# Patient Record
Sex: Female | Born: 1960 | Hispanic: No
Health system: Northeastern US, Academic
[De-identification: ages and names within clinical notes are randomized; demographics above are authoritative.]

---

## 2010-05-26 IMAGING — CR RAD CHEST PA LAT
2 series · 2 of 2 positions shown · non-contrast
Comparison: none

[left lateral]
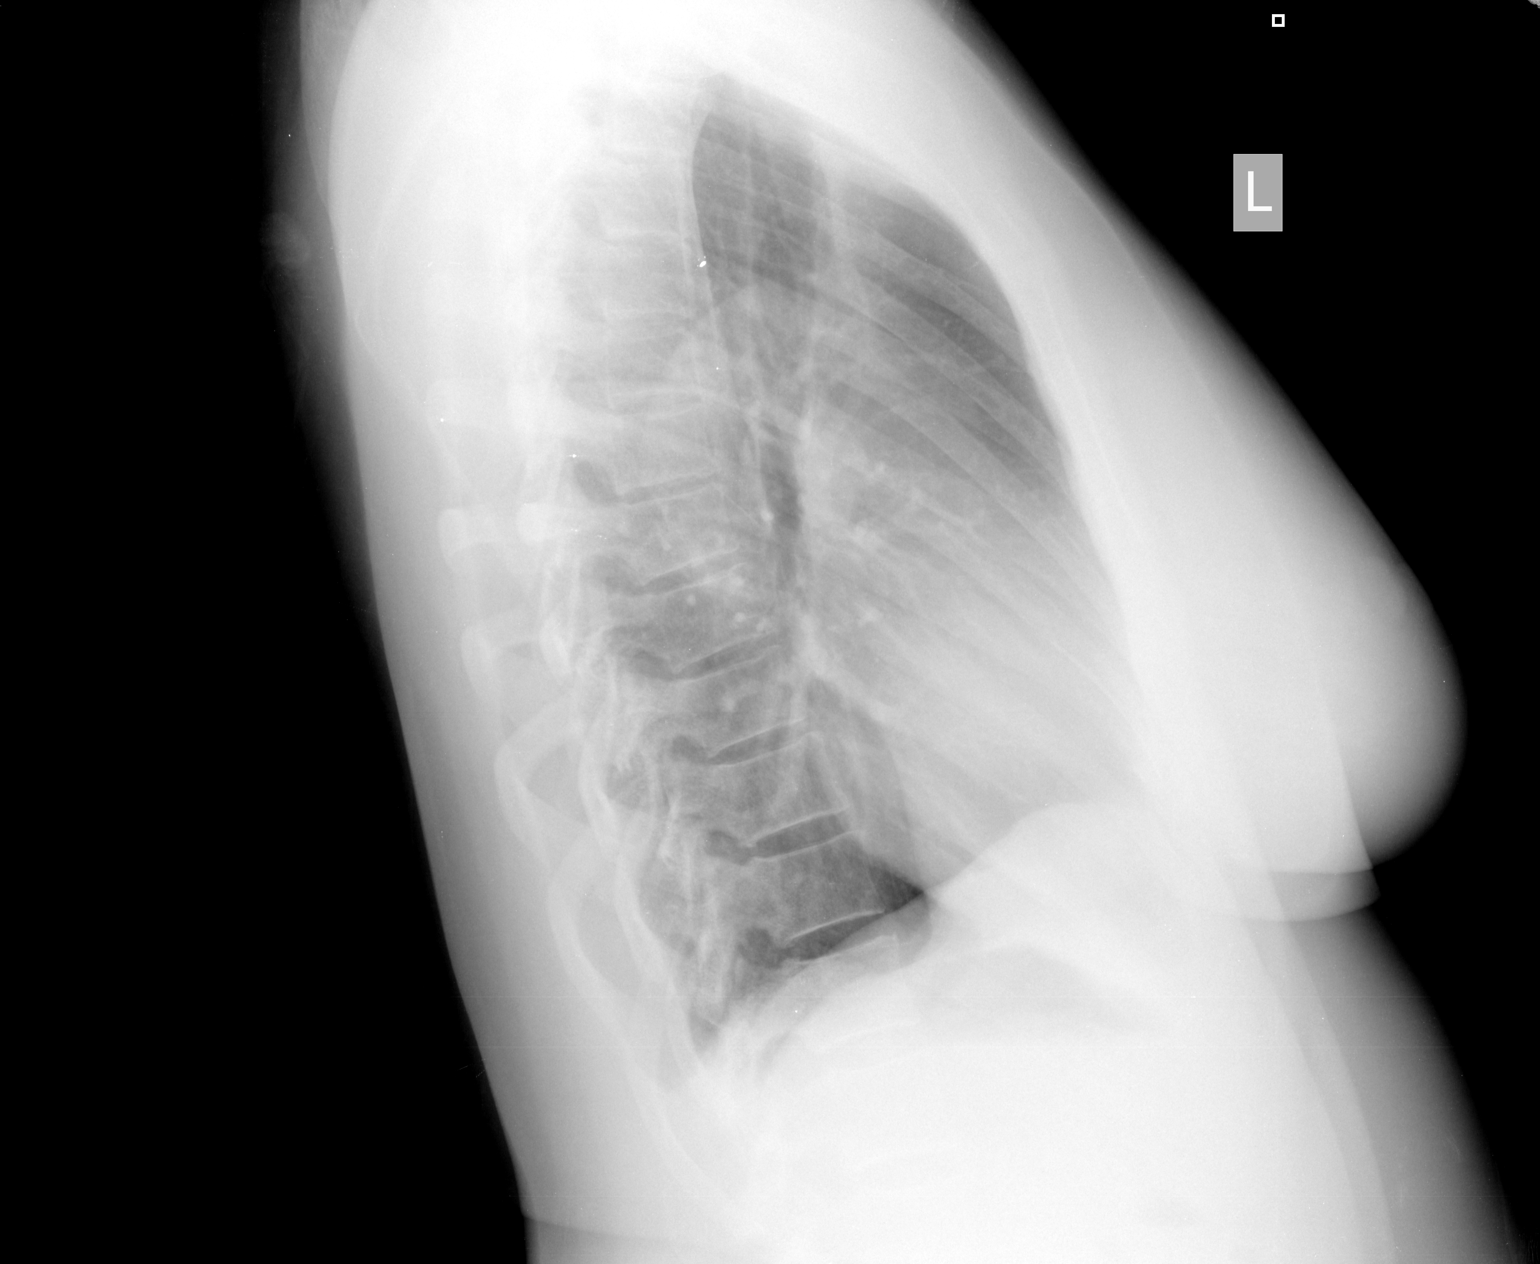

[PA]
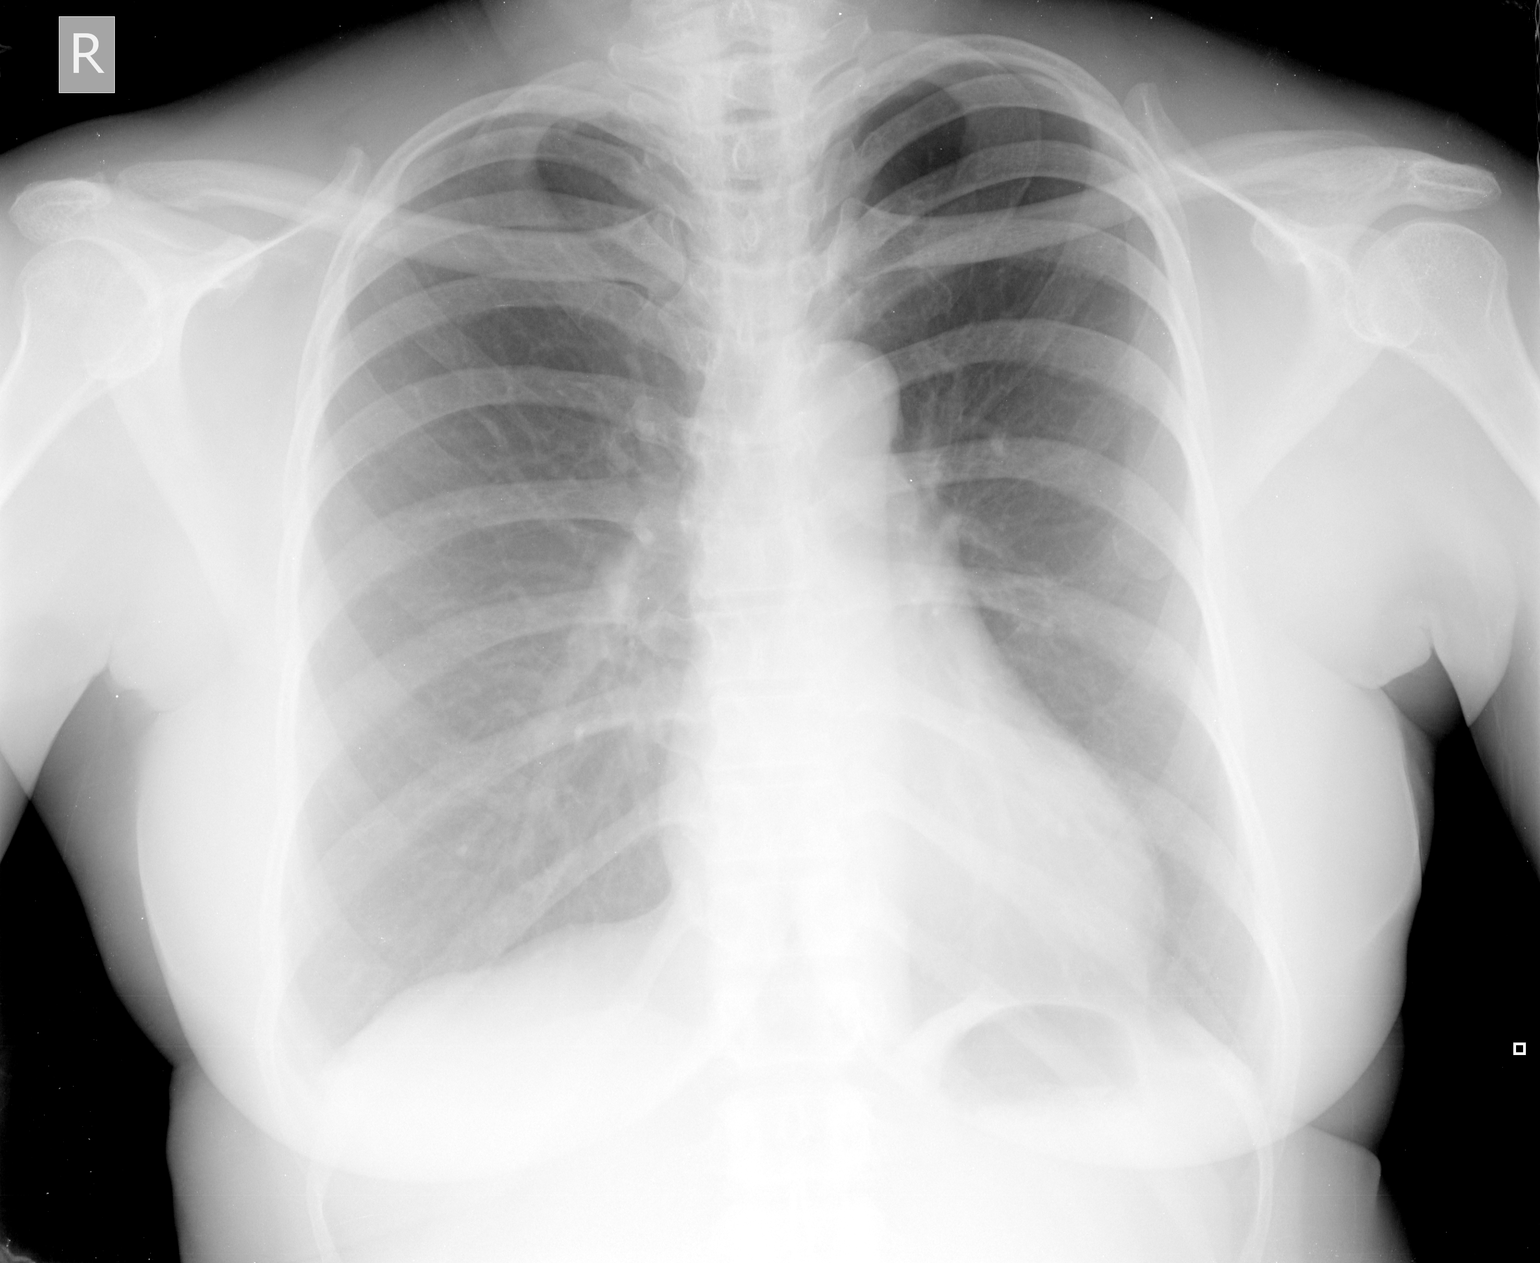

[2 of 2 positions shown; findings below may reference images not displayed]

CHEST-

The lungs, heart, pleura, mediastinum and bony structures are

unremarkable.

IMPRESSION-  Negative chest.

## 2020-02-22 ENCOUNTER — Emergency Department: Admit: 2020-02-22 | Payer: PRIVATE HEALTH INSURANCE

## 2020-02-22 ENCOUNTER — Inpatient Hospital Stay: Admit: 2020-02-22 | Discharge: 2020-02-22 | Payer: Auto Insurance (includes no fault)

## 2020-02-22 DIAGNOSIS — M25521 Pain in right elbow: Secondary | ICD-10-CM

## 2020-02-22 DIAGNOSIS — E119 Type 2 diabetes mellitus without complications: Secondary | ICD-10-CM

## 2020-02-22 DIAGNOSIS — I10 Essential (primary) hypertension: Secondary | ICD-10-CM

## 2020-02-22 DIAGNOSIS — M25572 Pain in left ankle and joints of left foot: Secondary | ICD-10-CM

## 2020-02-22 DIAGNOSIS — S9002XA Contusion of left ankle, initial encounter: Secondary | ICD-10-CM

## 2020-02-22 DIAGNOSIS — S0081XA Abrasion of other part of head, initial encounter: Secondary | ICD-10-CM

## 2020-02-22 DIAGNOSIS — S5001XA Contusion of right elbow, initial encounter: Principal | ICD-10-CM

## 2020-02-22 MED ORDER — IBUPROFEN 400 MG TABLET
400 mg | Freq: Once | ORAL | Status: CP
Start: 2020-02-22 — End: ?
  Administered 2020-02-22: 13:00:00 400 mg via ORAL
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
## 2020-02-22 NOTE — Discharge Instructions
REST, ELEVATE YOUR ELBOW AND ANKLE AS MUCH AS POSSIBLE, APPLY ICE TO AFFECTED AREAS SEVERAL TIMES A DAYDO NOT BEAR WEIGHT, USE CRUTCHES AND FOLLOW-UP WITH YOUR ORTHOPEDIST OR ONSCONTINUE TYLENOL/MOTRIN AS NEEDED FOR PAIN RETURN IF YOU DEVELOP INCREASED PAIN, SWELLING, INABILITY TO EXTEND OR BEND YOUR ARM, INABILITY TO BEAR WEIGHT ON YOUR FOOT, WEAKNESS/NUMBNESS OR COOL PALE HAND/FINGERS/ANKLE/FOOTRETURN IF YOU DEVELOP WEAKNESS/NUMBNESS OF YOUR FOOT, BLISTERING

## 2020-02-23 NOTE — Telephone Encounter
Resting and sore taking extra-strength motrin, burning sensation on her forehead but doing ok told to f/u or return if any sx's worsen
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
